# Patient Record
Sex: Female | Born: 2000 | Race: White | Hispanic: No | Marital: Single | Smoking: Never smoker
Health system: Southern US, Community
[De-identification: ages and names within clinical notes are randomized; demographics above are authoritative.]

## PROBLEM LIST (undated history)

## (undated) HISTORY — PX: TYMPANOSTOMY TUBE PLACEMENT: SHX32

## (undated) HISTORY — PX: INNER EAR SURGERY: SHX679

---
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
## 2022-11-12 ENCOUNTER — Emergency Department (HOSPITAL_COMMUNITY): Payer: Medicaid Other

## 2022-11-12 ENCOUNTER — Other Ambulatory Visit: Payer: Self-pay

## 2022-11-12 ENCOUNTER — Emergency Department (HOSPITAL_COMMUNITY)
Admission: EM | Admit: 2022-11-12 | Discharge: 2022-11-12 | Disposition: A | Discharge status: Home / Self Care | Payer: Medicaid Other | Attending: Emergency Medicine | Admitting: Emergency Medicine

## 2022-11-12 DIAGNOSIS — Z8709 Personal history of other diseases of the respiratory system: Secondary | ICD-10-CM

## 2022-11-12 DIAGNOSIS — R0789 Other chest pain: Secondary | ICD-10-CM | POA: Diagnosis present

## 2022-11-12 DIAGNOSIS — R0781 Pleurodynia: Secondary | ICD-10-CM

## 2022-11-12 LAB — CBC WITH DIFFERENTIAL/PLATELET
Abs Immature Granulocytes: 0.01 10*3/uL (ref 0.00–0.07)
Basophils Absolute: 0 10*3/uL (ref 0.0–0.1)
Basophils Relative: 0 %
Eosinophils Absolute: 0 10*3/uL (ref 0.0–0.5)
Eosinophils Relative: 0 %
HCT: 37.5 % (ref 36.0–46.0)
Hemoglobin: 12.8 g/dL (ref 12.0–15.0)
Immature Granulocytes: 0 %
Lymphocytes Relative: 39 %
Lymphs Abs: 2.3 10*3/uL (ref 0.7–4.0)
MCH: 32.3 pg (ref 26.0–34.0)
MCHC: 34.1 g/dL (ref 30.0–36.0)
MCV: 94.7 fL (ref 80.0–100.0)
Monocytes Absolute: 0.5 10*3/uL (ref 0.1–1.0)
Monocytes Relative: 9 %
Neutro Abs: 3.1 10*3/uL (ref 1.7–7.7)
Neutrophils Relative %: 52 %
Platelets: 235 10*3/uL (ref 150–400)
RBC: 3.96 MIL/uL (ref 3.87–5.11)
RDW: 12.2 % (ref 11.5–15.5)
WBC: 5.9 10*3/uL (ref 4.0–10.5)
nRBC: 0 % (ref 0.0–0.2)

## 2022-11-12 LAB — BASIC METABOLIC PANEL
Anion gap: 7 (ref 5–15)
BUN: 14 mg/dL (ref 6–20)
CO2: 26 mmol/L (ref 22–32)
Calcium: 9.5 mg/dL (ref 8.9–10.3)
Chloride: 104 mmol/L (ref 98–111)
Creatinine, Ser: 0.61 mg/dL (ref 0.44–1.00)
GFR, Estimated: >60 mL/min (ref >60)
Glucose, Bld: 95 mg/dL (ref 70–99)
Potassium: 3.6 mmol/L (ref 3.5–5.1)
Sodium: 137 mmol/L (ref 135–145)

## 2022-11-12 LAB — TROPONIN I (HIGH SENSITIVITY): Troponin I (High Sensitivity): <2 ng/L (ref <18)

## 2022-11-12 NOTE — ED Provider Notes (Signed)
[facility] Provider Note   CSN: [id number] Arrival date & time: 11/12/22  1045     History  Chief Complaint  Patient presents with   Chest Pain    Angela Mack is a 22 y.o. female.  HPI     22 year old female comes in with chief complaint of chest pain. Patient had spontaneous pneumothorax in August or September of last year.  She states that ever since then she has been having intermittent episodes of left-sided chest pain that radiates towards her axilla.  Pain is unprovoked, described as sharp and will last anywhere from few seconds to 15 to 20 minutes.  Patient will have multiple episodes throughout the day, and the frequency has worsened more recently, prompting her to come to the emergency room.  The spontaneous pneumothorax was treated without intervention, and it healed on its own. Patient vapes every day. Pt has no hx of PE, DVT and denies any exogenous hormone (testosterone / estrogen) use, long distance travels or surgery in the past 6 weeks, active cancer, recent immobilization.  Additionally, she states that she has history of irregular heartbeat and was given follow-up with cardiology, but she has not had a chance to do so.  She is requesting another cardiology referral.  Home Medications Prior to Admission medications   Medication Sig Start Date End Date Taking? Authorizing Provider  ibuprofen (ADVIL,MOTRIN) 400 MG tablet Take 1 tablet (400 mg total) by mouth every 6 (six) hours as needed. 03/24/17   Lily Kocher, PA-C  traMADol (ULTRAM) 50 MG tablet Take 1 tablet (50 mg total) by mouth every 6 (six) hours as needed. 03/24/17   Lily Kocher, PA-C      Allergies    Patient has no known allergies.    Review of Systems   Review of Systems  All other systems reviewed and are negative.   Physical Exam Updated Vital Signs BP 135/80   Pulse 95   Temp 98.1 F (36.7 C) (Oral)   Resp 16   Ht 5' 3"$  (1.6 m)    Wt 46.7 kg   LMP 10/29/2022   SpO2 100%   BMI 18.25 kg/m  Physical Exam Vitals and nursing note reviewed.  Constitutional:      Appearance: She is well-developed.  HENT:     Head: Atraumatic.  Cardiovascular:     Rate and Rhythm: Normal rate.  Pulmonary:     Effort: Pulmonary effort is normal.  Musculoskeletal:     Cervical back: Normal range of motion and neck supple.  Skin:    General: Skin is warm and dry.  Neurological:     Mental Status: She is alert and oriented to person, place, and time.     ED Results / Procedures / Treatments   Labs (all labs ordered are listed, but only abnormal results are displayed) Labs Reviewed  BASIC METABOLIC PANEL  CBC WITH DIFFERENTIAL/PLATELET  TROPONIN I (HIGH SENSITIVITY)  TROPONIN I (HIGH SENSITIVITY)    EKG EKG Interpretation  Date/Time:  Thursday November 12 2022 10:32:44 EST Ventricular Rate:  103 PR Interval:  126 QRS Duration: 86 QT Interval:  376 QTC Calculation: 492 R Axis:   82 Text Interpretation: Sinus tachycardia Biatrial enlargement Nonspecific ST and T wave abnormality Abnormal ECG No previous ECGs available No acute changes No significant change since last tracing Confirmed by Varney Biles [id number]) on 11/12/2022 4:37:24 PM  Radiology DG Chest 2 View  Result Date: 11/12/2022 CLINICAL DATA:  Chest pain EXAM: CHEST - 2 VIEW COMPARISON:  08/19/2022 FINDINGS: The heart size and mediastinal contours are within normal limits. Both lungs are clear. The visualized skeletal structures are unremarkable. IMPRESSION: No active cardiopulmonary disease. Electronically Signed   By: Jerilynn Mages.  Shick M.D.   On: 11/12/2022 11:38    Procedures Procedures    Medications Ordered in ED Medications - No data to display  ED Course/ Medical Decision Making/ A&P                             Medical Decision Making 22 year old female comes in with chief complaint of intermittent chest pain. Patient has history of primary  spontaneous pneumothorax that resolved on its own.  She is having intermittent pleuritic chest pain episodes.  Differential diagnosis considered includes acute coronary syndrome, pericarditis, pleurisy, musculoskeletal pain, acute pneumothorax.  X-ray of the chest was independently interpreted.  There is no evidence of acute pneumothorax.  We have advised continued use of Tylenol and over-the-counter NSAIDs for symptom management.  If her symptoms continue to get worse, she will call [facility] to see if they have any recommendations/suggestions on additional workup and management.  Problems Addressed: Pleuritic chest pain: acute illness or injury  Amount and/or Complexity of Data Reviewed Radiology: ordered.    Final Clinical Impression(s) / ED Diagnoses Final diagnoses:  Pleuritic chest pain  History of pneumothorax    Rx / DC Orders ED Discharge Orders          Ordered    Ambulatory referral to Cardiology       Comments: If you have not heard from the Cardiology office within the next 72 hours please call [phone].   11/12/22 [place], Markies Mowatt, MD 11/12/22 1705

## 2022-11-12 NOTE — Discharge Instructions (Signed)
Continue to take the all ibuprofen for your symptom management.  X-ray today does not reveal pneumothorax.  We have given you a phone number for pulmonary doctors to see if they have any further recommendations pertaining to your symptoms. Also provided is the referral to cardiology service.

## 2022-11-12 NOTE — ED Provider Triage Note (Signed)
Emergency Medicine Provider Triage Evaluation Note  Angela Mack , a 22 y.o. female  was evaluated in triage.  Pt complains of left-sided chest pain.  She states that she had a pneumothorax last year in September which was treated with high flow O2 and observation.  She states that since then she intermittently has left-sided sharp chest pain that comes randomly.  She states that it has been worse over the past week.  She denies shortness of breath, cough, fever.  Review of Systems  Positive: See above Negative:   Physical Exam  BP 135/85 (BP Location: Right Arm)   Pulse 89   Temp 98.1 F (36.7 C) (Oral)   Resp 18   Ht 5' 3"$  (1.6 m)   Wt 46.7 kg   LMP 10/29/2022   SpO2 100%   BMI 18.25 kg/m  Gen:   Awake, no distress    Resp:  Normal effort, lungs clear bilaterally MSK:   Moves extremities without difficulty  Other:  S1/S2 without murmur  Medical Decision Making  Medically screening exam initiated at 1:21 PM.  Appropriate orders placed.  Angela Mack was informed that the remainder of the evaluation will be completed by another provider, this initial triage assessment does not replace that evaluation, and the importance of remaining in the ED until their evaluation is complete.     Angela Hillier, PA-C 11/12/22 1326

## 2022-11-12 NOTE — ED Triage Notes (Signed)
Pt. Stated, I had a pneumothorax in August or Sept and Im having ongoing chest pain since I had the pneumothorax.

## 2022-11-12 NOTE — ED Notes (Signed)
Provider at bedside

## 2022-12-18 ENCOUNTER — Encounter: Payer: Self-pay | Admitting: Internal Medicine

## 2022-12-18 ENCOUNTER — Ambulatory Visit: Payer: Medicaid Other | Attending: Internal Medicine | Admitting: Internal Medicine

## 2022-12-18 VITALS — BP 118/76 | HR 87 | Ht 63.0 in | Wt 104.8 lb

## 2022-12-18 DIAGNOSIS — I499 Cardiac arrhythmia, unspecified: Secondary | ICD-10-CM | POA: Diagnosis not present

## 2022-12-18 DIAGNOSIS — Z87891 Personal history of nicotine dependence: Secondary | ICD-10-CM | POA: Diagnosis not present

## 2022-12-18 DIAGNOSIS — R0789 Other chest pain: Secondary | ICD-10-CM

## 2022-12-18 DIAGNOSIS — R0781 Pleurodynia: Secondary | ICD-10-CM | POA: Diagnosis not present

## 2022-12-18 NOTE — Patient Instructions (Signed)
Medication Instructions:  Your physician recommends that you continue on your current medications as directed. Please refer to the Current Medication list given to you today.  *If you need a refill on your cardiac medications before your next appointment, please call your pharmacy*   Lab Work: NONE   If you have labs (blood work) drawn today and your tests are completely normal, you will receive your results only by: MyChart Message (if you have MyChart) OR A paper copy in the mail If you have any lab test that is abnormal or we need to change your treatment, we will call you to review the results.   Testing/Procedures: NONE    Follow-Up: At [facility], you and your health needs are our priority.  As part of our continuing mission to provide you with exceptional heart care, we have created designated Provider Care Teams.  These Care Teams include your primary Cardiologist (physician) and Advanced Practice Providers (APPs -  Physician Assistants and Nurse Practitioners) who all work together to provide you with the care you need, when you need it.  We recommend signing up for the patient portal called "MyChart".  Sign up information is provided on this After Visit Summary.  MyChart is used to connect with patients for Virtual Visits (Telemedicine).  Patients are able to view lab/test results, encounter notes, upcoming appointments, etc.  Non-urgent messages can be sent to your provider as well.   To learn more about what you can do with MyChart, go to [email or web address].    Your next appointment:    As Needed   Provider:   Vishnu Mallipeddi, MD    Other Instructions Thank you for choosing [facility]!    

## 2022-12-18 NOTE — Progress Notes (Signed)
Cardiology Office Note  Date: 12/18/2022   ID: [name], DOB 2001/08/03, MRN [id number]  PCP:  Pcp, No  Cardiologist:  None Electrophysiologist:  None   Reason for Office Visit: Evaluation of irregular heartbeat at the request of Dr. Kathrynn Humble   History of Present Illness: Angela Mack is a 22 y.o. female with no PMH was referred to cardiology clinic for evaluation of irregular heartbeat.  Patient has spontaneous pneumothorax in 05/2022 which healed on its own but had residual persistent pleuritic chest pain. It got worse requiring ER visit in 10/2022. Vitals and labs were unremarkable at the time. Patient reported having sharp chest pains lasting from a few minutes to 15 to 20 minutes radiating to her left axilla (area where she had pneumothorax), occurs at rest and worse with deep inspiration, resolve spontaneously.  This has been ongoing since 05/2022. She was also told by an nurse during 05/2022 ER visit at [facility] that she had irregular heartbeat. Atrial flutter runs in her family on her mother side, her mother and maternal grandfather had atrial flutter. She denies any palpitations, SOB or fatigue. EKG from [facility] showed normal sinus rhythm with sinus arrhythmia. Otherwise, she exercises 3 times per week, jogs and goes out for walks with her kids with no symptoms of chest pain during exertion. She never smoked cigarettes but started vaping 3 times per week, with nicotine. Denied drinking coffee or energy drinks.   Past Surgical History:  Procedure Laterality Date   INNER EAR SURGERY Right    to repair hole in ear from tube placement   TYMPANOSTOMY TUBE PLACEMENT Bilateral     Current Outpatient Medications  Medication Sig Dispense Refill   medroxyPROGESTERone (DEPO-PROVERA) 150 MG/ML injection Inject 150 mg into the muscle every 3 (three) months.     ibuprofen (ADVIL,MOTRIN) 400 MG tablet Take 1 tablet (400 mg total) by mouth every 6 (six)  hours as needed. (Patient not taking: Reported on 12/18/2022) 30 tablet 0   traMADol (ULTRAM) 50 MG tablet Take 1 tablet (50 mg total) by mouth every 6 (six) hours as needed. (Patient not taking: Reported on 12/18/2022) 15 tablet 0   No current facility-administered medications for this visit.   Allergies:  Patient has no known allergies.   Social History: The patient  reports that she has never smoked. She has never used smokeless tobacco. She reports that she does not drink alcohol and does not use drugs.   Family History: The patient's family history is not on file.   ROS:  Please see the history of present illness. Otherwise, complete review of systems is positive for none.  All other systems are reviewed and negative.   Physical Exam: VS:  BP 118/76   Pulse 87   Ht 5\' 3"  (1.6 m)   Wt 104 lb 12.8 oz (47.5 kg)   SpO2 100%   BMI 18.56 kg/m , BMI Body mass index is 18.56 kg/m.  Wt Readings from Last 3 Encounters:  12/18/22 104 lb 12.8 oz (47.5 kg)  11/12/22 103 lb (46.7 kg)  03/24/17 98 lb (44.5 kg) (5 %, Z= -1.68)*   * Growth percentiles are based on CDC (Girls, 2-20 Years) data.    General: Patient appears comfortable at rest. HEENT: Conjunctiva and lids normal, oropharynx clear with moist mucosa. Neck: Supple, no elevated JVP or carotid bruits, no thyromegaly. Lungs: Clear to auscultation, nonlabored breathing at rest. Cardiac: Regular rate and rhythm, no S3 or significant systolic murmur, no  pericardial rub. Abdomen: Soft, nontender, no hepatomegaly, bowel sounds present, no guarding or rebound. Extremities: No pitting edema, distal pulses 2+. Skin: Warm and dry. Musculoskeletal: No kyphosis. Neuropsychiatric: Alert and oriented x3, affect grossly appropriate.  ECG: Normal sinus rhythm  Recent Labwork: 11/12/2022: BUN 14; Creatinine, Ser 0.61; Hemoglobin 12.8; Platelets 235; Potassium 3.6; Sodium 137  No results found for: "CHOL", "TRIG", "HDL", "CHOLHDL", "VLDL",  "LDLCALC", "LDLDIRECT"  Other Studies Reviewed Today:   Assessment and Plan: Patient is a 22 year old F with no PMH was referred to cardiology clinic for evaluation of chest pain and irregular heartbeat.  # Noncardiac chest pain # Pleuritic chest pain -Pain management -Follow-up with pulmonary  # History of irregular heartbeat -EKGs on the EMR showed normal sinus rhythm. No palpitations, SOB or fatigue. No indication of event monitor at this time.  # Vaping -Vaping cessation counseling provided, currently vapes 3 times per week (has nicotine).  I have spent a total of 30 minutes with patient reviewing chart, EKGs, labs and examining patient as well as establishing an assessment and plan that was discussed with the patient.  > 50% of time was spent in direct patient care.     Medication Adjustments/Labs and Tests Ordered: Current medicines are reviewed at length with the patient today.  Concerns regarding medicines are outlined above.   Tests Ordered: No orders of the defined types were placed in this encounter.   Medication Changes: No orders of the defined types were placed in this encounter.   Disposition:  Follow up  as needed  Signed, Charlestine Rookstool Fidel Levy, MD, 12/18/2022 7:35 AM    [facility] at [facility] [address] [place], [place] [address]

## 2022-12-21 NOTE — Progress Notes (Unsigned)
[name], female    DOB: August 02, 2001    MRN: [id number]   Brief patient profile:  63  yowf  formerly vaped quit June 2023 and ptx Sept 2023  rx conservatively referred to pulmonary clinic in [facility]  12/22/2022 by [facility] for persistent L CP p ptx   Off benazpril ?  Feb 2022 but says it didn't make her cough   CP was 10/10 at onset and improved to around a 4 stayed there until Nov 2023 spiking intermittently responds to tyl and nsaids    History of Present Illness  12/22/2022  Pulmonary/ 1st office eval/ [facility] / [facility] re cp now down to a 2 on ibuprofen initially and now tylenol  Chief Complaint  Patient presents with   Consult    Ed AP 2/15 advised to follow up with pulmonary after pneumothorax in 05/2022. Still has ongoing pain from pneumothorax   Dyspnea:  none though no aerobics / running outside chasing 4 y  Cough: none  Sleep: on side bed is level  SABA use: none  02: none  L Cp L pectoralis insertion rad to axilla  not positional not worse x  with really deep breath  Cards w/u neg   No obvious day to day or daytime pattern/variability or assoc excess/ purulent sputum or mucus plugs or hemoptysis  subjective wheeze or overt sinus or hb symptoms.   Sleeping  without nocturnal  or early am exacerbation  of respiratory  c/o's or need for noct saba. Also denies any obvious fluctuation of symptoms with weather or environmental changes or other aggravating or alleviating factors except as outlined above   No unusual exposure hx or h/o childhood pna/ asthma or knowledge of premature birth.  Current Allergies, Complete Past Medical History, Past Surgical History, Family History, and Social History were reviewed in [employer] record.  ROS  The following are not active complaints unless bolded Hoarseness, sore throat, dysphagia, dental problems, itching, sneezing,  nasal congestion or discharge of excess mucus or purulent secretions, ear  ache,   fever, chills, sweats, unintended wt loss or wt gain, classically  exertional cp,  orthopnea pnd or arm/hand swelling  or leg swelling, presyncope, palpitations, abdominal pain, anorexia, nausea, vomiting, diarrhea  or change in bowel habits or change in bladder habits, change in stools or change in urine, dysuria, hematuria,  rash, arthralgias, visual complaints, headache, numbness, weakness or ataxia or problems with walking or coordination,  change in mood or  memory.           No past medical history on file.  Outpatient Medications Prior to Visit  Medication Sig Dispense Refill   medroxyPROGESTERone (DEPO-PROVERA) 150 MG/ML injection Inject 150 mg into the muscle every 3 (three) months.     traMADol (ULTRAM) 50 MG tablet Take 1 tablet (50 mg total) by mouth every 6 (six) hours as needed. (Patient not taking: Reported on 12/22/2022) 15 tablet 0   ibuprofen (ADVIL,MOTRIN) 400 MG tablet Take 1 tablet (400 mg total) by mouth every 6 (six) hours as needed. 30 tablet 0   No facility-administered medications prior to visit.     Objective:     BP 122/62 (BP Location: Left Arm, Patient Position: Sitting)   Pulse 78   Ht 5\' 2"  (1.575 m)   Wt 109 lb 12.8 oz (49.8 kg)   SpO2 100% Comment: ra  BMI 20.08 kg/m   SpO2: 100 % (ra)  Pleasant amb wf  HEENT : Oropharynx  clear        NECK :  without  apparent JVD/ palpable Nodes/TM    LUNGS: no acc muscle use,  Nl contour chest which is clear to A and P bilaterally without cough on insp or exp maneuvers   CV:  RRR  no s3 or murmur or increase in P2, and no edema   ABD:  soft and nontender with nl inspiratory excursion in the supine position. No bruits or organomegaly appreciated   MS:  Nl gait/ ext warm without deformities Or obvious joint restrictions  calf tenderness, cyanosis or clubbing    SKIN: warm and dry without lesions    NEURO:  alert, approp, nl sensorium with  no motor or cerebellar deficits apparent.    I  personally reviewed images and agree with radiology impression as follows:  CXR:   pa and lateral 11/12/22 No active cardiopulmonary disease.       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  Total time for H and P, chart review, counseling, reviewing imaging with pt, and generating customized AVS unique to this office visit / same day charting > 30 min new pt eval.          Christinia Gully, MD 12/22/2022

## 2022-12-22 ENCOUNTER — Ambulatory Visit (INDEPENDENT_AMBULATORY_CARE_PROVIDER_SITE_OTHER): Payer: Medicaid Other | Admitting: Internal Medicine

## 2022-12-22 ENCOUNTER — Encounter: Payer: Self-pay | Admitting: Internal Medicine

## 2022-12-22 VITALS — BP 122/62 | HR 78 | Ht 62.0 in | Wt 109.8 lb

## 2022-12-22 DIAGNOSIS — R0781 Pleurodynia: Secondary | ICD-10-CM

## 2022-12-22 NOTE — Assessment & Plan Note (Addendum)
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  Total time for H and P, chart review, counseling, reviewing imaging with pt, and generating customized AVS unique to this office visit / same day charting > 30 min new pt eval.

## 2022-12-22 NOTE — Patient Instructions (Addendum)
Ok to take advil (Ibuprofen) 200 mg up to 1- 2 with each meal (max 1200 mg per day) if needed to control pain which should gradually resolve over the next 3 months or so   If  a lot worse pain that doesn't respond to advil or causes difficulty breathing return to ER immediately   Please remember to go to the lab department   for your tests - we will call you with the results when they are available.     Pulmonary follow up is as needed

## 2022-12-25 LAB — CBC WITH DIFFERENTIAL/PLATELET
Basophils Absolute: 0 10*3/uL (ref 0.0–0.2)
Basos: 1 %
EOS (ABSOLUTE): 0.1 10*3/uL (ref 0.0–0.4)
Eos: 1 %
Hematocrit: 35.3 % (ref 34.0–46.6)
Hemoglobin: 12.3 g/dL (ref 11.1–15.9)
Immature Grans (Abs): 0 10*3/uL (ref 0.0–0.1)
Immature Granulocytes: 0 %
Lymphocytes Absolute: 3.2 10*3/uL — ABNORMAL HIGH (ref 0.7–3.1)
Lymphs: 50 %
MCH: 32.1 pg (ref 26.6–33.0)
MCHC: 34.8 g/dL (ref 31.5–35.7)
MCV: 92 fL (ref 79–97)
Monocytes Absolute: 0.4 10*3/uL (ref 0.1–0.9)
Monocytes: 7 %
Neutrophils Absolute: 2.6 10*3/uL (ref 1.4–7.0)
Neutrophils: 41 %
Platelets: 247 10*3/uL (ref 150–450)
RBC: 3.83 x10E6/uL (ref 3.77–5.28)
RDW: 11.5 % — ABNORMAL LOW (ref 11.7–15.4)
WBC: 6.3 10*3/uL (ref 3.4–10.8)

## 2022-12-25 LAB — SEDIMENTATION RATE: Sed Rate: 2 mm/hr (ref 0–32)

## 2022-12-25 LAB — ALPHA-1-ANTITRYPSIN PHENOTYP: A-1 Antitrypsin: 117 mg/dL (ref 100–188)
